# Patient Record
Sex: Female | Born: 1995 | Race: Black or African American | Hispanic: No | Marital: Single | Smoking: Never smoker
Health system: Southern US, Community
[De-identification: ages and names within clinical notes are randomized; demographics above are authoritative.]

---

## 2019-03-16 ENCOUNTER — Emergency Department (HOSPITAL_COMMUNITY)
Admission: EM | Admit: 2019-03-16 | Discharge: 2019-03-16 | Disposition: A | Discharge status: Home / Self Care | Payer: Medicaid Other | Attending: Emergency Medicine | Admitting: Emergency Medicine

## 2019-03-16 ENCOUNTER — Emergency Department (HOSPITAL_COMMUNITY): Payer: Medicaid Other

## 2019-03-16 ENCOUNTER — Other Ambulatory Visit: Payer: Self-pay

## 2019-03-16 ENCOUNTER — Encounter (HOSPITAL_COMMUNITY): Payer: Self-pay

## 2019-03-16 DIAGNOSIS — S161XXA Strain of muscle, fascia and tendon at neck level, initial encounter: Secondary | ICD-10-CM | POA: Diagnosis not present

## 2019-03-16 DIAGNOSIS — S63502A Unspecified sprain of left wrist, initial encounter: Secondary | ICD-10-CM | POA: Diagnosis not present

## 2019-03-16 DIAGNOSIS — S40012A Contusion of left shoulder, initial encounter: Secondary | ICD-10-CM | POA: Diagnosis not present

## 2019-03-16 DIAGNOSIS — Y9389 Activity, other specified: Secondary | ICD-10-CM | POA: Diagnosis not present

## 2019-03-16 DIAGNOSIS — S4992XA Unspecified injury of left shoulder and upper arm, initial encounter: Secondary | ICD-10-CM | POA: Diagnosis present

## 2019-03-16 DIAGNOSIS — Y999 Unspecified external cause status: Secondary | ICD-10-CM | POA: Diagnosis not present

## 2019-03-16 DIAGNOSIS — Y92410 Unspecified street and highway as the place of occurrence of the external cause: Secondary | ICD-10-CM | POA: Diagnosis not present

## 2019-03-16 MED ORDER — NAPROXEN 250 MG PO TABS
500.0000 mg | ORAL_TABLET | Freq: Once | ORAL | Status: AC
  Administered 2019-03-16: 500 mg via ORAL
  Filled 2019-03-16: qty 2

## 2019-03-16 MED ORDER — NAPROXEN 500 MG PO TABS: 500.0000 mg | ORAL_TABLET | Freq: Two times a day (BID) | ORAL | 0 refills | Status: AC

## 2019-03-16 NOTE — Discharge Instructions (Signed)
Your testing today showed no signs of broken bones - you likely have some bruising and straining of your tissues / ligaments -   Please take Naprosyn, 500mg  by mouth twice daily as needed for pain - this in an antiinflammatory medicine (NSAID) and is similar to ibuprofen - many people feel that it is stronger than ibuprofen and it is easier to take since it is a smaller pill.  Please use this only for 1 week - if your pain persists, you will need to follow up with your doctor in the office for ongoing guidance and pain control.     If you should develop increased pain / redness / weakness / numbness or swelling, return for an evaluation immediately  [facility] Doctor List    Kari Baars MD. Specialty: Pulmonary Disease Contact information: [address]  [address]  [place] [address]  [phone]   Syliva Overman, MD. Specialty: [facility] Medicine Contact information: [address]  [place] [address]  [phone]   Lilyan Punt, MD. Specialty: [facility] Medicine Contact information: [address]  [address]  [place] [address]  [phone]   Avon Gully, MD Specialty: Internal Medicine Contact information: [address] [place] [address]  [phone]   Catalina Pizza, MD. Specialty: Internal Medicine Contact information: [address]  [place] [address]  [phone]    [facility] (Dr. Selena Batten) Specialty: Family Medicine Contact information: [address]  [place] [address]  [phone]   John Giovanni, MD. Specialty: [facility] Medicine Contact information: [address]  [address]  [place] [address]  [phone]   Carylon Perches, MD. Specialty: Internal Medicine Contact information: [address]  [address]  [place] [address]  [phone]    [facility]  [address] [place], [place] [address] [phone]  Services [facility] offers a variety of basic health services.  Services include but are not limited to: Blood pressure checks  Heart rate checks  Blood sugar checks  Urine analysis  Rapid strep tests  Pregnancy tests.  Health education and referrals  People needing more complex services will be directed to a physician online. Using these virtual visits, doctors can evaluate and prescribe medicine and treatments. There will be no medication on-site, though [facility] will help patients fill their prescriptions at little to no cost.   For More information please go to: [email or web address]

## 2019-03-16 NOTE — ED Notes (Signed)
From Rad 
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
## 2019-03-16 NOTE — ED Notes (Signed)
Dr Miller in to assess 
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
## 2021-08-28 ENCOUNTER — Other Ambulatory Visit: Payer: Self-pay

## 2021-08-28 ENCOUNTER — Encounter (HOSPITAL_COMMUNITY): Payer: Self-pay

## 2021-08-28 ENCOUNTER — Emergency Department (HOSPITAL_COMMUNITY)
Admission: EM | Admit: 2021-08-28 | Discharge: 2021-08-28 | Disposition: A | Discharge status: Home / Self Care | Payer: Medicaid Other | Attending: Emergency Medicine | Admitting: Emergency Medicine

## 2021-08-28 DIAGNOSIS — Z5321 Procedure and treatment not carried out due to patient leaving prior to being seen by health care provider: Secondary | ICD-10-CM | POA: Diagnosis not present

## 2021-08-28 DIAGNOSIS — R531 Weakness: Secondary | ICD-10-CM | POA: Insufficient documentation

## 2021-08-28 DIAGNOSIS — R232 Flushing: Secondary | ICD-10-CM | POA: Diagnosis not present

## 2021-08-28 DIAGNOSIS — R1110 Vomiting, unspecified: Secondary | ICD-10-CM | POA: Diagnosis not present

## 2021-08-28 DIAGNOSIS — R6883 Chills (without fever): Secondary | ICD-10-CM | POA: Diagnosis not present

## 2021-08-28 DIAGNOSIS — Z20822 Contact with and (suspected) exposure to covid-19: Secondary | ICD-10-CM | POA: Insufficient documentation

## 2021-08-28 DIAGNOSIS — R42 Dizziness and giddiness: Secondary | ICD-10-CM | POA: Diagnosis not present

## 2021-08-28 LAB — RESP PANEL BY RT-PCR (FLU A&B, COVID) ARPGX2
Influenza A by PCR: NEGATIVE
Influenza B by PCR: NEGATIVE
SARS Coronavirus 2 by RT PCR: NEGATIVE

## 2021-08-28 NOTE — ED Notes (Signed)
Called for Pt from waiting room. No answer X 2.

## 2021-08-28 NOTE — ED Triage Notes (Signed)
Pt arrived via POV c/o emesis since Tuesday. Pt reports hot flashes, chills, dizziness and weakness. Pt reports emesis everytime she tries to eat or drink something.

## 2022-11-30 ENCOUNTER — Emergency Department (HOSPITAL_COMMUNITY): Payer: Medicaid Other

## 2022-11-30 ENCOUNTER — Other Ambulatory Visit: Payer: Self-pay

## 2022-11-30 ENCOUNTER — Encounter (HOSPITAL_COMMUNITY): Payer: Self-pay

## 2022-11-30 ENCOUNTER — Emergency Department (HOSPITAL_COMMUNITY)
Admission: EM | Admit: 2022-11-30 | Discharge: 2022-11-30 | Disposition: A | Discharge status: Home / Self Care | Payer: Medicaid Other | Attending: Emergency Medicine | Admitting: Emergency Medicine

## 2022-11-30 DIAGNOSIS — S60211A Contusion of right wrist, initial encounter: Secondary | ICD-10-CM | POA: Diagnosis not present

## 2022-11-30 DIAGNOSIS — M25431 Effusion, right wrist: Secondary | ICD-10-CM

## 2022-11-30 DIAGNOSIS — M25531 Pain in right wrist: Secondary | ICD-10-CM | POA: Diagnosis present

## 2022-11-30 MED ORDER — IBUPROFEN 800 MG PO TABS
800.0000 mg | ORAL_TABLET | Freq: Once | ORAL | Status: AC
  Administered 2022-11-30: 800 mg via ORAL
  Filled 2022-11-30: qty 1

## 2022-11-30 MED ORDER — HYDROCODONE-ACETAMINOPHEN 5-325 MG PO TABS
1.0000 | ORAL_TABLET | Freq: Once | ORAL | Status: AC
  Administered 2022-11-30: 1 via ORAL
  Filled 2022-11-30: qty 1
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
## 2022-11-30 NOTE — ED Notes (Signed)
Patient transported to x-ray. Ice applied to right wrist.
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
## 2022-11-30 NOTE — Discharge Instructions (Addendum)
Evaluation today did not reveal that you have a fracture dislocation of the wrist.  Recommend conservative treatment at home which includes rest, ice, compression and elevation.  Recommend icing the joint every 2-3 hours for 15 minutes at a time for the next 2 to 3 days.  Advised that you follow-up with your PCP for further evaluation in 3 to 4 days.
